# Patient Record
Sex: Male | Born: 1970 | Race: White | Hispanic: No | Marital: Married | Smoking: Never smoker
Health system: Southern US, Community
[De-identification: ages and names within clinical notes are randomized; demographics above are authoritative.]

## PROBLEM LIST (undated history)

## (undated) DIAGNOSIS — K219 Gastro-esophageal reflux disease without esophagitis: Secondary | ICD-10-CM

## (undated) HISTORY — PX: JOINT REPLACEMENT: SHX530

## (undated) HISTORY — DX: Gastro-esophageal reflux disease without esophagitis: K21.9

## (undated) HISTORY — PX: KNEE SURGERY: SHX244

---

## 1998-05-31 ENCOUNTER — Emergency Department (HOSPITAL_COMMUNITY): Admission: EM | Admit: 1998-05-31 | Discharge: 1998-05-31 | Payer: Self-pay | Admitting: Emergency Medicine

## 2004-01-20 ENCOUNTER — Emergency Department (HOSPITAL_COMMUNITY): Admission: EM | Admit: 2004-01-20 | Discharge: 2004-01-20 | Payer: Self-pay | Admitting: Emergency Medicine

## 2004-01-29 ENCOUNTER — Encounter: Admission: RE | Admit: 2004-01-29 | Discharge: 2004-01-29 | Payer: Self-pay | Admitting: Emergency Medicine

## 2005-05-18 ENCOUNTER — Encounter: Payer: Self-pay | Admitting: Emergency Medicine

## 2005-09-25 ENCOUNTER — Emergency Department (HOSPITAL_COMMUNITY): Admission: EM | Admit: 2005-09-25 | Discharge: 2005-09-26 | Payer: Self-pay | Admitting: Emergency Medicine

## 2007-03-02 ENCOUNTER — Encounter: Admission: RE | Admit: 2007-03-02 | Discharge: 2007-03-02 | Payer: Self-pay | Admitting: Emergency Medicine

## 2013-07-23 ENCOUNTER — Ambulatory Visit (INDEPENDENT_AMBULATORY_CARE_PROVIDER_SITE_OTHER): Payer: BC Managed Care – PPO | Admitting: Emergency Medicine

## 2013-07-23 ENCOUNTER — Ambulatory Visit (HOSPITAL_COMMUNITY)
Admission: RE | Admit: 2013-07-23 | Discharge: 2013-07-23 | Disposition: A | Discharge status: Home / Self Care | Payer: BC Managed Care – PPO | Source: Ambulatory Visit | Attending: Emergency Medicine | Admitting: Emergency Medicine

## 2013-07-23 VITALS — BP 144/98 | HR 78 | Temp 97.7°F | Resp 18 | Ht 71.0 in | Wt 207.0 lb

## 2013-07-23 DIAGNOSIS — M7989 Other specified soft tissue disorders: Secondary | ICD-10-CM

## 2013-07-23 DIAGNOSIS — M79609 Pain in unspecified limb: Secondary | ICD-10-CM | POA: Insufficient documentation

## 2013-07-23 LAB — CBC WITH DIFFERENTIAL/PLATELET
BASOS ABS: 0 10*3/uL (ref 0.0–0.1)
BASOS PCT: 0 % (ref 0–1)
EOS ABS: 0.2 10*3/uL (ref 0.0–0.7)
Eosinophils Relative: 2 % (ref 0–5)
HEMATOCRIT: 36.9 % — AB (ref 39.0–52.0)
HEMOGLOBIN: 13 g/dL (ref 13.0–17.0)
Lymphocytes Relative: 28 % (ref 12–46)
Lymphs Abs: 2.9 10*3/uL (ref 0.7–4.0)
MCH: 29.1 pg (ref 26.0–34.0)
MCHC: 35.2 g/dL (ref 30.0–36.0)
MCV: 82.6 fL (ref 78.0–100.0)
MONO ABS: 0.4 10*3/uL (ref 0.1–1.0)
MONOS PCT: 4 % (ref 3–12)
Neutro Abs: 6.8 10*3/uL (ref 1.7–7.7)
Neutrophils Relative %: 66 % (ref 43–77)
Platelets: 344 10*3/uL (ref 150–400)
RBC: 4.47 MIL/uL (ref 4.22–5.81)
RDW: 13.3 % (ref 11.5–15.5)
WBC: 10.3 10*3/uL (ref 4.0–10.5)

## 2013-07-23 LAB — POCT SEDIMENTATION RATE: POCT SED RATE: 38 mm/h — AB (ref 0–22)

## 2013-07-23 MED ORDER — ESOMEPRAZOLE MAGNESIUM 40 MG PO CPDR: 40.0000 mg | DELAYED_RELEASE_CAPSULE | Freq: Two times a day (BID) | ORAL | Status: DC

## 2013-07-23 NOTE — Progress Notes (Signed)
VASCULAR LAB PRELIMINARY  PRELIMINARY  PRELIMINARY  PRELIMINARY  Left lower extremity venous duplex completed.    Preliminary report:  Left:  No evidence of DVT, superficial thrombosis, or Baker's cyst.  Amelita Risinger, RVS 07/23/2013, 4:51 PM

## 2013-07-23 NOTE — Progress Notes (Signed)
[facility] [address], [place] [address] [phone]  Date:  07/23/2013   Name:  Mike MaisJeffrey Freshour   DOB:  14-Jul-1970   MRN:  [id number]  PCP:  No primary provider on file.    Chief Complaint: Joint Swelling   History of Present Illness:  Mike MaisJeffrey Rinn is a 43 y.o. very pleasant male patient who presents with the following:  Increasing symptoms of reflux with heart burn and waterbrash.  No nausea or vomiting.  No stool change, blood in stool or black stools. No hematemesis.  No relief with 20 BID nexium.  No NSAID or alcohol.  Has pain and swelling in left leg since middle of June.  Pain and tenderness.  Most swelling below knee.  Difficult to ambulate at times due discomfort.  No weakness, injury or over use.  No history or immobilization or travel.   No improvement with over the counter medications or other home remedies. Denies other complaint or health concern today.   There are no active problems to display for this patient.   Past Medical History  Diagnosis Date  . GERD (gastroesophageal reflux disease)     Past Surgical History  Procedure Laterality Date  . Joint replacement    . Knee surgery      History  Substance Use Topics  . Smoking status: Never Smoker   . Smokeless tobacco: Not on file  . Alcohol Use: No    Family History  Problem Relation Age of Onset  . Cancer Mother     No Known Allergies  Medication list has been reviewed and updated.  No current outpatient prescriptions on file prior to visit.   No current facility-administered medications on file prior to visit.    Review of Systems:  As per HPI, otherwise negative.    Physical Examination: Filed Vitals:   07/23/13 1317  BP: 144/98  Pulse: 78  Temp: 97.7 F (36.5 C)  Resp: 18   Filed Vitals:   07/23/13 1317  Height: 5\' 11"  (1.803 m)  Weight: 207 lb (93.895 kg)   Body mass index is 28.88 kg/(m^2). Ideal Body Weight: Weight in (lb) to have BMI = 25:  178.9  GEN: WDWN, NAD, Non-toxic, A & O x 3 HEENT: Atraumatic, Normocephalic. Neck supple. No masses, No LAD. Ears and Nose: No external deformity. CV: RRR, No M/G/R. No JVD. No thrill. No extra heart sounds. PULM: CTA B, no wheezes, crackles, rhonchi. No retractions. No resp. distress. No accessory muscle use. ABD: S, NT, ND, +BS. No rebound. No HSM. EXTR: No c/c/e NEURO Normal gait.  PSYCH: Normally interactive. Conversant. Not depressed or anxious appearing.  Calm demeanor.  LEG:  2 cm difference between right and left calf.  Left larger with some ankle fullness.  No erythema   Assessment and Plan: Edema let Elevate Negative Echo  Signed,  Phillips OdorJeffery Shalaina Guardiola, MD

## 2013-07-29 ENCOUNTER — Telehealth: Payer: Self-pay | Admitting: *Deleted

## 2013-07-29 NOTE — Telephone Encounter (Signed)
Pt's wife called concerning recent lab work. Doesn't look like it has been reviewed yet . Please advise. Thanks

## 2013-07-29 NOTE — Telephone Encounter (Signed)
How is the patient

## 2014-08-19 ENCOUNTER — Encounter (HOSPITAL_COMMUNITY): Payer: Self-pay | Admitting: Emergency Medicine

## 2014-08-19 ENCOUNTER — Emergency Department (HOSPITAL_COMMUNITY)
Admission: EM | Admit: 2014-08-19 | Discharge: 2014-08-20 | Disposition: A | Discharge status: Home / Self Care | Payer: BLUE CROSS/BLUE SHIELD | Attending: Emergency Medicine | Admitting: Emergency Medicine

## 2014-08-19 ENCOUNTER — Ambulatory Visit (INDEPENDENT_AMBULATORY_CARE_PROVIDER_SITE_OTHER): Payer: BLUE CROSS/BLUE SHIELD | Admitting: Family Medicine

## 2014-08-19 ENCOUNTER — Ambulatory Visit (INDEPENDENT_AMBULATORY_CARE_PROVIDER_SITE_OTHER): Payer: BLUE CROSS/BLUE SHIELD

## 2014-08-19 VITALS — BP 130/80 | HR 89 | Temp 98.3°F | Resp 18 | Ht 70.0 in | Wt 214.1 lb

## 2014-08-19 DIAGNOSIS — R0789 Other chest pain: Secondary | ICD-10-CM | POA: Insufficient documentation

## 2014-08-19 DIAGNOSIS — M546 Pain in thoracic spine: Secondary | ICD-10-CM | POA: Diagnosis not present

## 2014-08-19 DIAGNOSIS — Z79899 Other long term (current) drug therapy: Secondary | ICD-10-CM | POA: Insufficient documentation

## 2014-08-19 DIAGNOSIS — R911 Solitary pulmonary nodule: Secondary | ICD-10-CM | POA: Diagnosis not present

## 2014-08-19 DIAGNOSIS — I10 Essential (primary) hypertension: Secondary | ICD-10-CM | POA: Insufficient documentation

## 2014-08-19 DIAGNOSIS — K219 Gastro-esophageal reflux disease without esophagitis: Secondary | ICD-10-CM | POA: Diagnosis not present

## 2014-08-19 DIAGNOSIS — R079 Chest pain, unspecified: Secondary | ICD-10-CM | POA: Diagnosis present

## 2014-08-19 LAB — CBC
HCT: 38.6 % — ABNORMAL LOW (ref 39.0–52.0)
Hemoglobin: 13.2 g/dL (ref 13.0–17.0)
MCH: 29.1 pg (ref 26.0–34.0)
MCHC: 34.2 g/dL (ref 30.0–36.0)
MCV: 85.2 fL (ref 78.0–100.0)
Platelets: 348 10*3/uL (ref 150–400)
RBC: 4.53 MIL/uL (ref 4.22–5.81)
RDW: 12.4 % (ref 11.5–15.5)
WBC: 13.5 10*3/uL — ABNORMAL HIGH (ref 4.0–10.5)

## 2014-08-19 LAB — BASIC METABOLIC PANEL
Anion gap: 10 (ref 5–15)
BUN: 8 mg/dL (ref 6–20)
CO2: 29 mmol/L (ref 22–32)
Calcium: 9.6 mg/dL (ref 8.9–10.3)
Chloride: 97 mmol/L — ABNORMAL LOW (ref 101–111)
Creatinine, Ser: 0.84 mg/dL (ref 0.61–1.24)
GFR calc Af Amer: >60 mL/min (ref >60)
GFR calc non Af Amer: >60 mL/min (ref >60)
Glucose, Bld: 67 mg/dL (ref 65–99)
Potassium: 4.3 mmol/L (ref 3.5–5.1)
Sodium: 136 mmol/L (ref 135–145)

## 2014-08-19 LAB — I-STAT TROPONIN, ED: TROPONIN I, POC: 0 ng/mL (ref 0.00–0.08)

## 2014-08-19 NOTE — Progress Notes (Signed)
[facility] [address] [place] [address] [phone]  Date:  08/19/2014   Name:  Mike Gutierrez   DOB:  1970/01/26   MRN:  [id number]  PCP:  Jamal Collin, PA-C    Chief Complaint: Flank Pain   History of Present Illness:  Mike Gutierrez is a 44 y.o. very pleasant male patient who presents with the following: - Left sided thoracic pain that began last night.  - He is relatively healthy besides a diagnosis of GERD.  - He has never smoked.  - The pain is very positional and much worse with a deep breath as well.  He describes it as a stabbing pain through his left side.  - No trauma. - No recent illnesses.  He has had a periodic cough over the summer, non-productive.  - No medication specifically for this pain.  He takes percocet for bilateral knee pain.  He did take an extra percocet (2x 10mg  tablets) prior to coming to our office. He has not tried taking an OTC meds, including NSAIDs.  - He also took 2 Xanax prior to coming here today.  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   He denies any recent long car rides or plane flights.  He denies any leg swelling or calf pain. He was evaluated for a DVT in the past, but it was negative.  - He denies any family history of CAD or blood clots. His mom did have an aortic dissection and was a non-smoker.   There are no active problems to display for this patient.   Past Medical History  Diagnosis Date  . GERD (gastroesophageal reflux disease)     Past Surgical History  Procedure Laterality Date  . Joint replacement    . Knee surgery      History  Substance Use Topics  . Smoking status: Never Smoker   . Smokeless tobacco: Not on file  . Alcohol Use: No    Family History  Problem Relation Age of Onset  . Cancer Mother     No Known Allergies  Medication list has been reviewed and updated.  Current Outpatient Prescriptions on File Prior to Visit  Medication Sig  Dispense Refill  . ALPRAZolam (XANAX) 0.5 MG tablet Take 0.5 mg by mouth at bedtime as needed for anxiety.    . OXYCODONE HCL PO Take 10 mg by mouth 3 (three) times daily.     No current facility-administered medications on file prior to visit.    Review of Systems: Review of Systems  Constitutional: Negative for fever, chills and diaphoresis.  HENT: Negative for congestion.   Respiratory: Positive for cough (spoadic). Negative for hemoptysis, sputum production, shortness of breath and wheezing.   Cardiovascular: Negative for chest pain, palpitations, orthopnea and leg swelling.  Gastrointestinal: Negative for heartburn, nausea, vomiting, abdominal pain and constipation.  Genitourinary: Negative for dysuria, hematuria and flank pain.  Musculoskeletal: Positive for back pain. Negative for falls.  Neurological: Negative for dizziness, tingling and headaches.  Endo/Heme/Allergies: Does not bruise/bleed easily.  Psychiatric/Behavioral: The patient is nervous/anxious.     Physical Examination: Filed Vitals:   08/19/14 1857  BP: 130/80  Pulse: 89  Temp: 98.3 F (36.8 C)  Resp: 18   Filed Vitals:   08/19/14 1857  Height: 5\' 10"  (1.778 m)  Weight: 214 lb 2 oz (97.126 kg)   Body mass index is 30.72 kg/(m^2). Ideal Body Weight: Weight in (lb) to have BMI =  25: 173.9  GEN: WDWN, NAD, Non-toxic, A & O x 3 HEENT: Atraumatic, Normocephalic. Neck supple. No masses, No LAD. Ears and Nose: No external deformity. CV: RRR, No M/G/R. No JVD. No thrill. No extra heart sounds. PULM: CTA B, no wheezes, crackles, rhonchi. No retractions. No resp. distress. No accessory muscle use.  Tender with palpation along left mid-axillary thoracic ribs from T5-7.  Vague, ill-defined rash over this area and along mid-axillary line (3 small, <77mm papules, superiorly and 2 inferiorly).    ABD: S, NT, ND, +BS. No rebound. No HSM. EXTR: No c/c/e. Negative Homan's NEURO Normal gait.  PSYCH: Normally interactive.  Conversant. Not depressed or anxious appearing.  Calm demeanor.  MSK: Full RoM without pain.   [facility] reading (PRIMARY) by Dr. Mayford Knife. CXR shows mildly increased parabronchial marking and possible widening of the mediastinum. No other acute findings.   EKG: Sinus rhythm, rate 83. Possible increase voltage in precordial leads with early repolarization likely. No other concerning findings. QTc 382.   Assessment and Plan: After evaluation here, the differential remains relatively wide. His vitals were stable.  He does have a poorly defined rash that could be the beginning of a shingles outbreak.  Additionally, his pain seems to be positional and pleuritic possibly suggesting a possible thoracic muscle strain, PE, or early pneumonia. During his time here he started to report pain that began extending down his left arm, suggesting potential progression of his pathology.  - CXR & EKG unremarkable for acute findings.  CXR sent for STAT read was also negative(there was an vague linear opacity in the left lung field that was favored to be atelectasis). - With the chronicity and progression of the symptoms, Mr. Miron was given the option of taking an ambulance or having his wife drive him to the ED. He chose to proceed via his own transportation.  The ED was notified about his impending arrival.    Signed Guinevere Scarlet, MD

## 2014-08-19 NOTE — ED Notes (Signed)
Pt. reports left /lateral chest pain onset last night increases with deep inspiration , mild SOB , denies nausea or diaphoresis , seen at [facility] today EKG and chest x-ray done .

## 2014-08-19 NOTE — ED Notes (Signed)
EDP at bedside  

## 2014-08-19 NOTE — Patient Instructions (Signed)
Left sided chest discomfort:  - We have done a chest XR and EKG today.  This did not reveal any obvious irregularities.  - With no definite cause for your discomfort, which has been progressive over the last 36 hours we feel it is in your best interest to go to the [facility] ER.   -  You need to go directly to the emergency room as this could be a life threatening issue.  Please go directly there.  We will call ahead and let them know you are headed there.

## 2014-08-19 NOTE — ED Provider Notes (Signed)
CSN: [id number]     Arrival date & time 08/19/14  2127 History   First MD Initiated Contact with Patient 08/19/14 2249     Chief Complaint  Patient presents with  . Chest Pain     (Consider location/radiation/quality/duration/timing/severity/associated sxs/prior Treatment) HPI   Blood pressure 131/72, pulse 87, temperature 98.8 F (37.1 C), temperature source Oral, resp. rate 12, weight 216 lb (97.977 kg), SpO2 98 %.  Mike Gutierrez is a 44 y.o. male  with a PMH of GERD and HTN who presents with left sided chest pain onset last night. The pain woke him from sleep on the left side and is worse with movement and deep inspiration. The pain radiates to the left jaw and down the left arm. He went to urgent care earlier tonight and was referred to the ED. Patient took 3-4 oxycodone 10mg  and 1 xanex 0.5mg  for the pain, which have not helped. He was initially hypoxic at 89% RA. Laying on the left side has been the only alleviating factor. He becomes short of breath with deep inspiration and feels sharp pleuritic pain. He denies cough, N/V, dizziness, diaphoresis, fever, numbness/tingling, or headache. He denies recent travel, hospitalization, immobilization, trauma, and swelling of the distal limbs. Patient states that he smoked 1 cigarette per day for 6 months over 10 years ago, he heard that would be helpful for his IBS.  Past Medical History  Diagnosis Date  . GERD (gastroesophageal reflux disease)    Past Surgical History  Procedure Laterality Date  . Joint replacement    . Knee surgery     Family History  Problem Relation Age of Onset  . Cancer Mother    History  Substance Use Topics  . Smoking status: Never Smoker   . Smokeless tobacco: Not on file  . Alcohol Use: No    Review of Systems  10 systems reviewed and found to be negative, except as noted in the HPI.   Allergies  Review of patient's allergies indicates no known allergies.  Home Medications   Prior to Admission  medications   Medication Sig Start Date End Date Taking? Authorizing Provider  ALPRAZolam Prudy Feeler) 0.5 MG tablet Take 0.5 mg by mouth at bedtime as needed for anxiety.   Yes Historical Provider, MD  dexlansoprazole (DEXILANT) 60 MG capsule Take 120 mg by mouth daily. 10/10/13  Yes Historical Provider, MD  OXYCODONE HCL PO Take 10 mg by mouth 3 (three) times daily.   Yes Historical Provider, MD  oxyCODONE-acetaminophen (PERCOCET) 10-325 MG per tablet Take 1 tablet by mouth every 4 (four) hours as needed for pain.   Yes Historical Provider, MD  perindopril (ACEON) 8 MG tablet Take 8 mg by mouth daily.   Yes Historical Provider, MD   BP 133/81 mmHg  Pulse 93  Temp(Src) 98.8 F (37.1 C) (Oral)  Resp 12  Wt 216 lb (97.977 kg)  SpO2 92% Physical Exam  Constitutional: He is oriented to person, place, and time. He appears well-developed and well-nourished. No distress.  HENT:  Head: Normocephalic.  Mouth/Throat: Oropharynx is clear and moist.  Eyes: Conjunctivae are normal.  Neck: Normal range of motion. No JVD present. No tracheal deviation present.  Cardiovascular: Normal rate, regular rhythm and intact distal pulses.   Radial pulse equal bilaterally  Pulmonary/Chest: Effort normal and breath sounds normal. No stridor. No respiratory distress. He has no wheezes. He has no rales. He exhibits no tenderness.  Abdominal: Soft. He exhibits no distension and no mass. There is  no tenderness. There is no rebound and no guarding.  Musculoskeletal: Normal range of motion. He exhibits no edema or tenderness.  No calf asymmetry, superficial collaterals, palpable cords, edema, Homans sign negative bilaterally.    Neurological: He is alert and oriented to person, place, and time.  Skin: Skin is warm. He is not diaphoretic.  Psychiatric: He has a normal mood and affect.  Nursing note and vitals reviewed.   ED Course  Procedures (including critical care time) Labs Review Labs Reviewed  BASIC METABOLIC  PANEL - Abnormal; Notable for the following:    Chloride 97 (*)    All other components within normal limits  CBC - Abnormal; Notable for the following:    WBC 13.5 (*)    HCT 38.6 (*)    All other components within normal limits  I-STAT TROPOININ, ED  Mike Gutierrez, ED    Imaging Review Dg Chest 2 View  08/19/2014   CLINICAL DATA:  Left-sided chest pain tonight.  EXAM: CHEST  2 VIEW  COMPARISON:  None.  FINDINGS: The cardiac silhouette, mediastinal and hilar contours are within normal limits. Streaky left basilar density is likely atelectasis. No pleural effusion or pulmonary edema. The bony thorax is intact. Remote healed right mid clavicle fracture.  IMPRESSION: Streaky left basilar density is likely atelectasis. No definite infiltrates, effusions or edema.   Electronically Signed   By: Rudie Meyer M.D.   On: 08/19/2014 20:32     EKG Interpretation   Date/Time:  Tuesday August 19 2014 21:33:16 EDT Ventricular Rate:  94 PR Interval:  164 QRS Duration: 96 QT Interval:  330 QTC Calculation: 412 R Axis:   47 Text Interpretation:  Normal sinus rhythm Non-specific ST-t changes No old  tracing to compare Confirmed by KOHUT  MD, STEPHEN [phone]) on 08/19/2014  11:16:42 PM      MDM   Final diagnoses:  Pulmonary nodule  Atypical chest pain    Filed Vitals:   08/19/14 2230 08/19/14 2245 08/19/14 2300 08/20/14 0000  BP: 150/86 138/86 133/81 132/65  Pulse: 92 89 93 89  Temp:      TempSrc:      Resp: 17 9 12 9   Weight:      SpO2: 100% 93% 92% 97%    Medications  iohexol (OMNIPAQUE) 350 MG/ML injection 80 mL (80 mLs Intravenous Contrast Given 08/20/14 0007)    Mike Gutierrez is a pleasant 44 y.o. male presenting with left-sided chest pain radiating to the jaw and down the left arm. Patient is found to be hypoxic with a sat of 89% initially, there is no tachycardia. This may be secondary to drug overuse, patient took 30 mg of oxycodone in addition to Xanax this afternoon. His  EKG shows nonspecific changes, troponin is negative, patient with severe pain, hypoxia. Patient is low risk by heart score. Although clinically he doesn't look to have a DVT as a source of his pain cannot be readily identified, there is no real tenderness palpation on his chest wall is reasonable to obtain a CT to rule out PE. Bilateral blood pressure is less than 10 mmHg asymmetry.  Chest CT without pulmonary embolism, they noted a lingular nodule that is concerning for malignancy. They recommend PET/CT possible biopsy.  Discussed with patient, advised him to call follow closely with primary care and pulmonology referral is given as well.  Patient will be given Toradol in the ED, advised him to start taking a high-dose Motrin tomorrow. I cautioned him on taking oxycodone  and Xanax as directed, have explained to him that this will decreased his respiratory drive and could be fatal. Patient is wife verbalized understanding.  Delta troponin is negative. Patient has ambulated and maintained his oxygen saturation greater than 90%. Discussed case with attending physician we have reviewed the CT, she advises that this can be followed up as an outpatient and agrees with care plan and disposition.   Evaluation does not show pathology that would require ongoing emergent intervention or inpatient treatment. Pt is hemodynamically stable and mentating appropriately. Discussed findings and plan with patient/guardian, who agrees with care plan. All questions answered. Return precautions discussed and outpatient follow up given.       Wynetta Emery, PA-C 08/20/14 1528  Marisa Severin, MD 08/21/14 [phone]

## 2014-08-20 ENCOUNTER — Emergency Department (HOSPITAL_COMMUNITY): Payer: BLUE CROSS/BLUE SHIELD

## 2014-08-20 LAB — I-STAT TROPONIN, ED: TROPONIN I, POC: 0 ng/mL (ref 0.00–0.08)

## 2014-08-20 MED ORDER — IOHEXOL 350 MG/ML SOLN
80.0000 mL | Freq: Once | INTRAVENOUS | Status: AC | PRN
  Administered 2014-08-20: 80 mL via INTRAVENOUS

## 2014-08-20 MED ORDER — KETOROLAC TROMETHAMINE 30 MG/ML IJ SOLN
30.0000 mg | Freq: Once | INTRAMUSCULAR | Status: AC
  Administered 2014-08-20: 30 mg via INTRAVENOUS
  Filled 2014-08-20: qty 1

## 2014-08-20 NOTE — Discharge Instructions (Signed)
For pain control please take ibuprofen (also known as Motrin or Advil)  (this is normally 4 over the counter pills) 3 times a day  for 5 days. Take with food to minimize stomach irritation.  Your imaging today shows nodules in your lungs. Your primary care doctor must be made aware of this and further imaging may be ordered. Please make them aware that they will need to obtain records for further management.  Please follow with your primary care doctor in the next 2 days for a check-up. They must obtain records for further management.   Do not hesitate to return to the Emergency Department for any new, worsening or concerning symptoms.    Chest Wall Pain Chest wall pain is pain in or around the bones and muscles of your chest. It may take up to 6 weeks to get better. It may take longer if you must stay physically active in your work and activities.  CAUSES  Chest wall pain may happen on its own. However, it may be caused by:  A viral illness like the flu.  Injury.  Coughing.  Exercise.  Arthritis.  Fibromyalgia.  Shingles. HOME CARE INSTRUCTIONS   Avoid overtiring physical activity. Try not to strain or perform activities that cause pain. This includes any activities using your chest or your abdominal and side muscles, especially if heavy weights are used.  Put ice on the sore area.  Put ice in a plastic bag.  Place a towel between your skin and the bag.  Leave the ice on for 15-20 minutes per hour while awake for the first 2 days.  Only take over-the-counter or prescription medicines for pain, discomfort, or fever as directed by your caregiver. SEEK IMMEDIATE MEDICAL CARE IF:   Your pain increases, or you are very uncomfortable.  You have a fever.  Your chest pain becomes worse.  You have new, unexplained symptoms.  You have nausea or vomiting.  You feel sweaty or lightheaded.  You have a cough with phlegm (sputum), or you cough up blood. MAKE SURE YOU:     Understand these instructions.  Will watch your condition.  Will get help right away if you are not doing well or get worse. Document Released: 01/03/2005 Document Revised: 03/28/2011 Document Reviewed: 08/30/2010 [facility] Patient Information 2015 [place], [place]. This information is not intended to replace advice given to you by your health care provider. Make sure you discuss any questions you have with your health care provider.

## 2014-08-20 NOTE — ED Notes (Signed)
While ambulating Pt in hallway O2 Saturation dropped down to 90 percent. PA Pisciotta aware.

## 2014-08-20 NOTE — ED Notes (Signed)
EDP at bedside  

## 2014-08-20 NOTE — ED Notes (Signed)
O2 noted to be 89% on RA. Pt placed on 3LO2, sats went up to 97%

## 2014-08-21 NOTE — Progress Notes (Signed)
Patient discussed and examined with Dr. Williams. Agree with assessment and plan of care per his note.   

## 2014-08-22 ENCOUNTER — Ambulatory Visit (INDEPENDENT_AMBULATORY_CARE_PROVIDER_SITE_OTHER): Payer: BLUE CROSS/BLUE SHIELD | Admitting: Internal Medicine

## 2014-08-22 ENCOUNTER — Encounter: Payer: Self-pay | Admitting: Internal Medicine

## 2014-08-22 ENCOUNTER — Institutional Professional Consult (permissible substitution): Payer: BLUE CROSS/BLUE SHIELD | Admitting: Internal Medicine

## 2014-08-22 VITALS — BP 148/88 | HR 100 | Ht 71.0 in | Wt 211.0 lb

## 2014-08-22 DIAGNOSIS — J690 Pneumonitis due to inhalation of food and vomit: Secondary | ICD-10-CM

## 2014-08-22 DIAGNOSIS — K2100 Gastro-esophageal reflux disease with esophagitis, without bleeding: Secondary | ICD-10-CM

## 2014-08-22 DIAGNOSIS — R0683 Snoring: Secondary | ICD-10-CM | POA: Diagnosis not present

## 2014-08-22 DIAGNOSIS — K219 Gastro-esophageal reflux disease without esophagitis: Secondary | ICD-10-CM | POA: Insufficient documentation

## 2014-08-22 DIAGNOSIS — R1310 Dysphagia, unspecified: Secondary | ICD-10-CM

## 2014-08-22 DIAGNOSIS — R091 Pleurisy: Secondary | ICD-10-CM | POA: Diagnosis not present

## 2014-08-22 DIAGNOSIS — G4719 Other hypersomnia: Secondary | ICD-10-CM | POA: Insufficient documentation

## 2014-08-22 MED ORDER — LEVOFLOXACIN 750 MG PO TABS: 750.0000 mg | ORAL_TABLET | Freq: Every day | ORAL | Status: DC

## 2014-08-22 NOTE — Assessment & Plan Note (Signed)
-  Pain on inspiration, consistent with pleurisy, likely related to abnormalities seen on CT chest

## 2014-08-22 NOTE — Assessment & Plan Note (Signed)
The patient notes episodes of choking sensation and feeling that food is being stuck in his chest. This may be related to GERD and possible esophageal narrowing. -The patient has had an endoscopy in the past. He notes that these symptoms are new since that time. Therefore, I have referred him back to his gastroenterologist to have this reexamined. -This patient is cautioned not to eat 4 hours before bedtime. Also counseled him on the fatty meals to earlier in the day. -Patient is to continue on his current

## 2014-08-22 NOTE — Assessment & Plan Note (Signed)
-  The patient has occasional episodes of dysphagia and choking on food secondary to dysphagia. -The patient on chewing his food properly and eating slowly

## 2014-08-22 NOTE — Assessment & Plan Note (Addendum)
-  The patient's wife is present for evaluation today and notes that the patient snores loudly at night. Patient also complains of daytime sleepiness occasionally insomnia. -The patient is medications for ADD contributing to insomnia. -We'll discuss possibility of sending the patient for sleep study

## 2014-08-22 NOTE — Assessment & Plan Note (Signed)
-  Small area of consolidation seen in the left lingular area. Given the patient's history of recent possible aspiration episodes. This may be consistent with an episode of aspiration pneumonia. -Patient is given a course of Levaquin 750 mg by mouth 7 days. -I doubt that this represents a pulmonary nodule. We will repeat a CT of his chest in approximately 6 weeks' time to ensure resolution.

## 2014-08-22 NOTE — Patient Instructions (Signed)
Refer back to gastroenterology for dysphagia.  No eating at least 4 before bedtime.  levaquin 750 mg daily for 5 days. Repeat CT chest in 6 to 8 weeks and follow up after that.  Naproxen OTC; 2 tabs twice daily until pain subsides. If not better in 1 week call.

## 2014-08-22 NOTE — Progress Notes (Signed)
Mike Gutierrez Mike Gutierrez      Assessment and Plan: Aspiration pneumonia -Small area of consolidation seen in the left lingular area. Given the patient's history of recent possible aspiration episodes. This may be consistent with an episode of aspiration pneumonia. -Patient is given a course of Levaquin 750 mg by mouth 7 days. -I doubt that this represents a pulmonary nodule. We will repeat a CT of his chest in approximately 6 weeks' time to ensure resolution.  Pleurisy -Pain on inspiration, consistent with pleurisy, likely related to abnormalities seen on CT chest  GERD (gastroesophageal reflux disease) The patient notes episodes of choking sensation and feeling that food is being stuck in his chest. This may be related to GERD and possible esophageal narrowing. -The patient has had an endoscopy in the past. He notes that these symptoms are new since that time. Therefore, I have referred him back to his gastroenterologist to have this reexamined. -This patient is cautioned not to eat 4 hours before bedtime. Also counseled him on the fatty meals to earlier in the day. -Patient is to continue on his current  Dysphagia -The patient has occasional episodes of dysphagia and choking on food secondary to dysphagia. -The patient on chewing his food properly and eating slowly  Excessive daytime sleepiness -The patient's wife is present for evaluation today and notes that the patient snores loudly at night. Patient also complains of daytime sleepiness occasionally insomnia. -The patient is medications for ADD contributing to insomnia. -We'll discuss possibility of sending the patient for sleep study      Date: 08/22/2014  MRN# [id number] Mike Gutierrez 03-Mar-1970  Referring Physician:   Kamel Gutierrez is a 44 y.o. old male seen in [facility] for   CC:  Chief Complaint  Patient presents with  . Advice Only    pain on LT side into back at chest line; since Monday  night; woke up gasping for air Sunday night; sharp pain with deep breath; hurts while laying in certain positions; chest tightness;     HPI:  The patient is a 44 year old nonsmoker with a history of GERD and anxiety. He was seen by his primary care physician 08/19/2014 due to Left-sided thoracic pain. At that time, the patient was noted to be sharp and stabbing in nature and made worse by deep breaths. He was referred to the emergency room. He underwent a chest x-ray and CT scan. I reviewed the images and the reports, the chest x-ray appeared to be unremarkable aside for some left lower lobe streaky atelectasis. CT of the chest from 08/19/2014 shows some areas of pneumonia versus atelectasis in the left lingula.  The problem started 3 days ago, he woke up with sharp stabbing pain and was having trouble taking a deep breath. He noted sharp pain on the left side. He had some chills. He had no recent cold, they have 3 kids but no one was sick. He denies cough. He has dysphagia issues, he notices that  2 to 3 times per day he developing coughing spells, his mother had to have her esophagus stretched. He notices that he feels that his food gets stuck in his throat. He has had an endoscopy gastritis. Since the endoscopy which was about a year ago he has noted that food is getting stuck in his throat.   Other than this recent episode he denies trouble breathing, he is not a smoker and has no significant smoking history.   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 He has a history of irritable bowel.     PMHX:   Past Medical History  Diagnosis Date  . GERD (gastroesophageal reflux disease)    Surgical Hx:  Past Surgical History  Procedure Laterality Date  . Joint replacement    . Knee surgery     Family Hx:  Family History  Problem Relation Age of Onset  . Cancer Mother    Social Hx:   History  Substance Use Topics  . Smoking status: Never Smoker   . Smokeless tobacco: Not on  file  . Alcohol Use: No   Medication:   Current Outpatient Rx  Name  Route  Sig  Dispense  Refill  . ALPRAZolam (XANAX) 0.5 MG tablet   Oral   Take 0.5 mg by mouth at bedtime as needed for anxiety.         Marland Kitchen amphetamine-dextroamphetamine (ADDERALL) 30 MG tablet   Oral   Take 60 mg by mouth daily.         Marland Kitchen dexlansoprazole (DEXILANT) 60 MG capsule   Oral   Take 120 mg by mouth daily.         . OXYCODONE HCL PO   Oral   Take 10 mg by mouth 3 (three) times daily.         . perindopril (ACEON) 8 MG tablet   Oral   Take 8 mg by mouth daily.             Allergies:  Review of patient's allergies indicates no known allergies.  Review of Systems: Gen:  Denies  fever, sweats, chills HEENT: Denies blurred vision, double vision, ear pain, Cvc:  No dizziness, chest pain or heaviness Resp:   Denies cough or sputum porduction, shortness of breath Gi: Denies swallowing difficulty, stomach pain, Gu:  Denies bladder incontinence, burning urine Ext:   No Joint pain, stiffness or swelling Skin: No skin rash, easy bruising or bleeding or hives Endoc:  No polyuria, polydipsia , polyphagia Psych: No depression, insomnia or hallucinations  Other:  All other systems negative  Physical Examination:   VS: BP 148/88 mmHg  Pulse 100  Ht 5\' 11"  (1.803 m)  Wt 211 lb (95.709 kg)  BMI 29.44 kg/m2  SpO2 99%  General Appearance: No distress  Neuro:without focal findings,  speech normal,  HEENT: PERRLA, EOM intact.  Mallampati 3 Pulmonary: normal breath sounds, No wheezing, No rales;    CardiovascularNormal S1,S2.  No m/r/g.   Abdomen: Benign, Soft, non-tender. Renal:  No costovertebral tenderness  GU:  No performed at this time. Endoc: No evident thyromegaly, no signs of acromegaly or Cushing features Skin:   warm, no rashes, no ecchymosis  Extremities: normal, no cyanosis, clubbing.    Orders for this visit: No orders of the defined types were placed in this encounter.       Thank  you for the Gutierrez and for allowing [facility] to assist in the care of your patient. Our recommendations are noted above.  Please contact us if we can be of further service.   Mike Guiles, MD.  Board Certified in Internal Medicine, Pulmonary Medicine, Critical Care Medicine, and Sleep Medicine.  [facility] Office Number: [phone]  Santiago Glad, M.D.  Stephanie Acre, M.D.  Carolyne Fiscal, M.D

## 2014-08-24 ENCOUNTER — Telehealth: Payer: Self-pay | Admitting: Pulmonary Disease

## 2014-08-24 MED ORDER — DOXYCYCLINE HYCLATE 100 MG PO CAPS: 100.0000 mg | ORAL_CAPSULE | Freq: Two times a day (BID) | ORAL | Status: DC

## 2014-08-24 NOTE — Telephone Encounter (Signed)
Patient started on Levaquin for pneumonia recently. Wife reports he has had altered mentation with mild myalgias & arthalgias today in his knees and calf. Reportedly he had "chest pain" this morning on his left side as well.  Wife denies any rash or itching. Describes his feeling as "out of body". Wife reports he is "seeing spots" when he closes his eyes. Currently on day 3 of 5 of treatment. Denies any achilles tendonitis. Instructed to stop Levaquin. Starting Doxycycline  po bid x7 days. She'll call the office tomorrow to receive further directions. Also instructed to seek immediate medical attention if any new symptoms arise.

## 2014-08-25 ENCOUNTER — Telehealth: Payer: Self-pay | Admitting: Internal Medicine

## 2014-08-25 NOTE — Telephone Encounter (Signed)
Pt wife aware of rec's per BQ - aware to call if any increased SOB, chest pain or high fever.  Cont Doxy as directed. Nothing further needed.

## 2014-08-25 NOTE — Telephone Encounter (Signed)
Per phone note 08/24/14 Roslynn Amble, MD at 08/24/2014 1:59 PM     Status: Signed       Expand All Collapse All   Patient started on Levaquin for pneumonia recently. Wife reports he has had altered mentation with mild myalgias & arthalgias today in his knees and calf. Reportedly he had "chest pain" this morning on his left side as well. Wife denies any rash or itching. Describes his feeling as "out of body". Wife reports he is "seeing spots" when he closes his eyes. Currently on day 3 of 5 of treatment. Denies any achilles tendonitis. Instructed to stop Levaquin. Starting Doxycycline  po bid x7 days. She'll call the office tomorrow to receive further directions. Also instructed to seek immediate medical attention if any new symptoms arise.        Spoke with pt wife - calling about new meds Rxd by Doc on call (Dr Jamison Neighbor) 08/24/14.  Pt using aleve prn chest discomfort. Stopped Levaquin 08/25/14. Pt started Doxycycline  today (around 9:30am). Wife reports rattling in chest today.  Pt wife would like to know if this is normal improvement (mucus breaking up) as he takes abx or if this is a sign of his infection worsening? Please advise Dr Kendrick Fries as Dr Rosilyn Mings is not available until 09/01/14. Thanks.

## 2014-08-25 NOTE — Telephone Encounter (Signed)
That can be normal with pneumonia.  Typically people will produce more mucus after a few days of treatment. It would be worrisome if he is more short of breath or having chest pain or a high fever. rec keep taking the doxycycline

## 2014-09-01 ENCOUNTER — Encounter: Payer: Self-pay | Admitting: Internal Medicine

## 2014-09-01 ENCOUNTER — Ambulatory Visit (INDEPENDENT_AMBULATORY_CARE_PROVIDER_SITE_OTHER): Payer: BLUE CROSS/BLUE SHIELD | Admitting: Internal Medicine

## 2014-09-01 ENCOUNTER — Ambulatory Visit
Admission: RE | Admit: 2014-09-01 | Discharge: 2014-09-01 | Disposition: A | Discharge status: Home / Self Care | Payer: BLUE CROSS/BLUE SHIELD | Source: Ambulatory Visit | Attending: Internal Medicine | Admitting: Internal Medicine

## 2014-09-01 VITALS — BP 168/110 | HR 67 | Wt 211.0 lb

## 2014-09-01 DIAGNOSIS — G4719 Other hypersomnia: Secondary | ICD-10-CM | POA: Diagnosis not present

## 2014-09-01 DIAGNOSIS — J690 Pneumonitis due to inhalation of food and vomit: Secondary | ICD-10-CM

## 2014-09-01 DIAGNOSIS — K2100 Gastro-esophageal reflux disease with esophagitis, without bleeding: Secondary | ICD-10-CM

## 2014-09-01 DIAGNOSIS — R1310 Dysphagia, unspecified: Secondary | ICD-10-CM

## 2014-09-01 DIAGNOSIS — R091 Pleurisy: Secondary | ICD-10-CM

## 2014-09-01 DIAGNOSIS — R0683 Snoring: Secondary | ICD-10-CM | POA: Diagnosis not present

## 2014-09-01 DIAGNOSIS — R918 Other nonspecific abnormal finding of lung field: Secondary | ICD-10-CM | POA: Insufficient documentation

## 2014-09-01 MED ORDER — PREDNISONE 10 MG PO TABS: ORAL_TABLET | ORAL | Status: DC

## 2014-09-01 NOTE — Patient Instructions (Addendum)
--  2 view chest xray today.  --Will start prednisone taper, take 5- 10 mg tabs for 1 day, then decrease by 1 tab daily until down to 1 tablet (5-4-3-2-1, etc). Continue taking 1 tablet daily for 1 week (total of 23 tabs). --Continue monitoring dietary intake.  --Call gastroenterologist for follow up appointment.  --CT chest in 4 weeks as scheduled at next appointment.

## 2014-09-01 NOTE — Progress Notes (Signed)
[facility]     Assessment and Plan: Aspiration pneumonia -Small area of consolidation seen in the left lingular area. Given the patient's history of recent possible aspiration episodes. This may be consistent with an episode of aspiration pneumonia.the patient had an episode of chest painand possible reaction to the Levaquin. Therefore, this was stoppedand the patient was put on doxycycline.He has now completed the Doxycycline. -I doubt that this represents a pulmonary nodule. We will repeat a CT of his chest in approximately 1 months time to ensure resolution.  Pleurisy -his pleuritic chest pain has not resolved. Therefore, we will start a course of prednisone instead of naproxen. I will also send him for a 2 view chest xray today.   Excessive daytime sleepiness -possible obstructive sleep apnea -We will send for sleep study once his overall condition improves.  Snoring -possible sleep apnea.  Dysphagia -May be contributing to aspiration.  -will need follow-up evaluation with Gastroenterology as he may require esophageal stretching.       Date: 09/01/2014  MRN# [id number] Mike Gutierrez Jul 11, 1970  HPI:   He has been doing better for a few days, but then the pain came back while he was working in the yard, he was using the vacuum at the time. It was not as bad as it had been in the past. Ht came on again yesterday and could not get a deep breath.  [place] to [place] and is worried about travelling.  He thinks he is now feeling better,  He felt wired with the levaquin and was changed to doxy which he has completed. The pain is better, but is still present.  He been coughing up some stuff over the week, which was green, that is better now.   He was taking ibuprofen every 4 hours and aleve every 6 hours, but did not think that they were helping, and stopped taking them.     PMHX:   Past Medical History  Diagnosis Date  . GERD  (gastroesophageal reflux disease)    Surgical Hx:  Past Surgical History  Procedure Laterality Date  . Joint replacement    . Knee surgery     Family Hx:  Family History  Problem Relation Age of Onset  . Cancer Mother    Social Hx:   Social History  Substance Use Topics  . Smoking status: Never Smoker   . Smokeless tobacco: None  . Alcohol Use: No   Medication:   Current Outpatient Rx  Name  Route  Sig  Dispense  Refill  . ALPRAZolam (XANAX) 0.5 MG tablet   Oral   Take 0.5 mg by mouth at bedtime as needed for anxiety.         Marland Kitchen amphetamine-dextroamphetamine (ADDERALL) 30 MG tablet   Oral   Take 60 mg by mouth daily.         Marland Kitchen dexlansoprazole (DEXILANT) 60 MG capsule   Oral   Take 120 mg by mouth daily.         . OXYCODONE HCL PO   Oral   Take 10 mg by mouth 3 (three) times daily.         . perindopril (ACEON) 8 MG tablet   Oral   Take 8 mg by mouth daily.             Allergies:  Review of patient's allergies indicates no known allergies.  Review of Systems: Gen:  Denies  fever, sweats, chills HEENT: Denies  blurred vision, double vision, Cvc:  No dizziness, chest pain or heaviness Resp:   Denies cough or sputum porduction,  Gi: Denies swallowing difficulty, stomach pain, Gu:  Denies bladder incontinence, burning urine Ext:   No Joint pain, stiffness or swelling Skin: No skin rash, easy bruising or bleeding or hives Endoc:  No polyuria, polydipsia , polyphagia Psych: No depression, insomnia or hallucinations  Other:  All other systems negative  Physical Examination:   VS: BP 168/110 mmHg  Pulse 67  Wt 211 lb (95.709 kg)  SpO2 99%  General Appearance: No distress  Neuro:without focal findings,  speech normal,  HEENT: PERRLA, EOM intact.  Mallampati 3 Pulmonary: normal breath sounds, No wheezing, No rales;    CardiovascularNormal S1,S2.  No m/r/g.   Abdomen: Benign, Soft, non-tender. Renal:  No costovertebral tenderness  GU:  No  performed at this time. Endoc: No evident thyromegaly. Skin:   warm, no rashes, no ecchymosis  Extremities: normal, no cyanosis, clubbing.    Orders for this visit: No orders of the defined types were placed in this encounter.        Wells Guiles, MD.  Board Certified in Internal Medicine, Pulmonary Medicine, Critical Care Medicine, and Sleep Medicine.  [facility] Office Number: [phone]  Santiago Glad, M.D.  Stephanie Acre, M.D.  Carolyne Fiscal, M.D

## 2014-09-01 NOTE — Assessment & Plan Note (Addendum)
-  May be contributing to aspiration.  -will need follow-up evaluation with Gastroenterology as he may require esophageal stretching.

## 2014-09-01 NOTE — Assessment & Plan Note (Addendum)
-  Small area of consolidation seen in the left lingular area. Given the patient's history of recent possible aspiration episodes. This may be consistent with an episode of aspiration pneumonia.the patient had an episode of chest painand possible reaction to the Levaquin. Therefore, this was stoppedand the patient was put on doxycycline.He has now completed the Doxycycline. -I doubt that this represents a pulmonary nodule. We will repeat a CT of his chest in approximately 1 months time to ensure resolution.

## 2014-09-01 NOTE — Assessment & Plan Note (Signed)
-  possible sleep apnea.

## 2014-09-01 NOTE — Assessment & Plan Note (Signed)
-  possible obstructive sleep apnea -We will send for sleep study once his overall condition improves.

## 2014-09-01 NOTE — Addendum Note (Signed)
Addended by: Meyer Cory R on: 09/01/2014 03:57 PM   Modules accepted: Orders

## 2014-09-01 NOTE — Assessment & Plan Note (Signed)
-  his pleuritic chest pain has not resolved. Therefore, we will start a course of prednisone instead of naproxen

## 2014-09-01 NOTE — Addendum Note (Signed)
Addended by: Meyer Cory R on: 09/01/2014 03:53 PM   Modules accepted: Orders

## 2014-10-10 ENCOUNTER — Ambulatory Visit
Admission: RE | Admit: 2014-10-10 | Discharge: 2014-10-10 | Disposition: A | Discharge status: Home / Self Care | Payer: BLUE CROSS/BLUE SHIELD | Source: Ambulatory Visit | Attending: Internal Medicine | Admitting: Internal Medicine

## 2014-10-10 DIAGNOSIS — I2510 Atherosclerotic heart disease of native coronary artery without angina pectoris: Secondary | ICD-10-CM | POA: Insufficient documentation

## 2014-10-10 DIAGNOSIS — Z09 Encounter for follow-up examination after completed treatment for conditions other than malignant neoplasm: Secondary | ICD-10-CM | POA: Insufficient documentation

## 2014-10-10 DIAGNOSIS — J690 Pneumonitis due to inhalation of food and vomit: Secondary | ICD-10-CM

## 2016-05-05 ENCOUNTER — Institutional Professional Consult (permissible substitution): Payer: BLUE CROSS/BLUE SHIELD | Admitting: Pulmonary Disease

## 2016-05-24 ENCOUNTER — Ambulatory Visit (INDEPENDENT_AMBULATORY_CARE_PROVIDER_SITE_OTHER): Payer: BLUE CROSS/BLUE SHIELD | Admitting: Pulmonary Disease

## 2016-05-24 ENCOUNTER — Encounter: Payer: Self-pay | Admitting: Pulmonary Disease

## 2016-05-24 ENCOUNTER — Ambulatory Visit (INDEPENDENT_AMBULATORY_CARE_PROVIDER_SITE_OTHER)
Admission: RE | Admit: 2016-05-24 | Discharge: 2016-05-24 | Disposition: A | Discharge status: Home / Self Care | Payer: BLUE CROSS/BLUE SHIELD | Source: Ambulatory Visit | Attending: Pulmonary Disease | Admitting: Pulmonary Disease

## 2016-05-24 VITALS — BP 154/90 | HR 72 | Ht 71.0 in | Wt 213.0 lb

## 2016-05-24 DIAGNOSIS — R0600 Dyspnea, unspecified: Secondary | ICD-10-CM

## 2016-05-24 DIAGNOSIS — R0602 Shortness of breath: Secondary | ICD-10-CM

## 2016-05-24 NOTE — Patient Instructions (Signed)
Try to focus on your breathing while you are at work, intentionally take deep breaths as we discussed Exercise, just 10 minutes of walking in the morning is a good start We will arrange for a chest x-ray and breathing test We will see you back in 6-8 weeks or sooner if needed

## 2016-05-24 NOTE — Progress Notes (Signed)
Subjective:    Patient ID: Mike Gutierrez, male    DOB: February 19, 1970, 46 y.o.   MRN: [id number]  Synopsis:  HPI Chief Complaint  Patient presents with  . Advice Only    Former PR pt re-establishing care with our office- presents with increased SOB    Mike Gutierrez would like to be seen because he has had a couple of episodes of pneumonia.  He was evaluated in the past for aspiration pneumonia and as a part of that work up he was told he had a "spot" on his lung.  He recalls that when he had pneumonia in the past he had severe right sided chest pain.  He ultimately saw pulmonary and was treated with antibiotics which made the symptoms go away.    He notes some dyspnea when he exerts himself.  > this is worse in the afternoon > typically not in the mornings > better if he takes a deep breath > worse when he bends over > just walking on level ground will make him short of breath > he has noticed that he will hold his breath while focused on something at work that requires his attention > this has been worse in the last 6 months > intentionally changing his breathing patterns makes it better  He doesn't have time to exercise.   He never smoked cigarettes in the past.  Never told he had a lung problem like asthma as a child.     Past Medical History:  Diagnosis Date  . GERD (gastroesophageal reflux disease)      Family History  Problem Relation Age of Onset  . Cancer Mother      Social History   Social History  . Marital status: Married    Spouse name: N/A  . Number of children: N/A  . Years of education: N/A   Occupational History  . Not on file.   Social History Main Topics  . Smoking status: Never Smoker  . Smokeless tobacco: Never Used  . Alcohol use No  . Drug use: No  . Sexual activity: Not on file   Other Topics Concern  . Not on file   Social History Narrative  . No narrative on file     No Known Allergies   Outpatient Medications Prior to Visit    Medication Sig Dispense Refill  . ALPRAZolam (XANAX) 0.5 MG tablet Take 0.5 mg by mouth at bedtime as needed for anxiety.    Marland Kitchen. amphetamine-dextroamphetamine (ADDERALL) 30 MG tablet Take 60 mg by mouth daily.    . OXYCODONE HCL PO Take 10 mg by mouth 3 (three) times daily.    . perindopril (ACEON) 8 MG tablet Take 8 mg by mouth daily.    Marland Kitchen. dexlansoprazole (DEXILANT) 60 MG capsule Take 120 mg by mouth daily.    . predniSONE (DELTASONE) 10 MG tablet Take 5 10mg  tabs for 1 day, then decrease by 1 tab daily until down to 1 tab then continue taking 1 tab daily for 1 week (Patient not taking: Reported on 05/24/2016) 23 tablet 0   No facility-administered medications prior to visit.       Review of Systems  Constitutional: Negative for fever and unexpected weight change.  HENT: Negative for congestion, dental problem, ear pain, nosebleeds, postnasal drip, rhinorrhea, sinus pressure, sneezing, sore throat and trouble swallowing.   Eyes: Negative for redness and itching.  Respiratory: Positive for chest tightness, shortness of breath and wheezing. Negative for cough.   Cardiovascular: Negative  for palpitations and leg swelling.  Gastrointestinal: Negative for nausea and vomiting.  Genitourinary: Negative for dysuria.  Musculoskeletal: Negative for joint swelling.  Skin: Negative for rash.  Neurological: Negative for headaches.  Hematological: Does not bruise/bleed easily.  Psychiatric/Behavioral: Negative for dysphoric mood. The patient is not nervous/anxious.        Objective:   Physical Exam Vitals:   05/24/16 1604  BP: (!) 154/90  Pulse: 72  SpO2: 98%  Weight: 213 lb (96.6 kg)  Height: 5\' 11"  (1.803 m)    RA  Gen: well appearing, no acute distress HENT: NCAT, OP clear, neck supple without masses Eyes: PERRL, EOMi Lymph: no cervical lymphadenopathy PULM: CTA B CV: RRR, no mgr, no JVD GI: BS+, soft, nontender, no hsm Derm: no rash or skin breakdown MSK: normal bulk and  tone Neuro: A&Ox4, CN II-XII intact, strength 5/5 in all 4 extremities Psyche: normal mood and affect  Records from his visit to pulmonary in 2016 reviewed were he was seen for presumed aspiration pneumonia  CT chest images from September 2016 independently reviewed showing normal pulmonary parenchyma bilaterally with a small scar in the lingula consistent with resolving pneumonia      Assessment & Plan:  Dyspnea Mike Gutierrez is here to see me today primarily for dyspnea which has been worsening in the last 6 months. 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 Interestingly, he has security camera set up around the shop and he has noticed that he holds his breath for long periods of time while he is working. This may in fact be the cause of his dyspnea as his lungs are clear on exam today, his oxygenation is normal, and imaging from his chest in 2016 was completely normal.  That said, the differential diagnosis of dyspnea is broad and includes lung disease, heart disease, bleeding/anemia and neuromuscular weakness. Based on his physical exam today I cannot identify any of these problems. He did have some coronary calcification seen on his 2016 CT chest.  Plan: Pulmonary function test Chest x-ray I encouraged him to exercise more frequently I encouraged him to focus on his breathing while at work Return to clinic in 6-8 weeks, if dyspnea has not improved then pursue a cardiac workup.    Current Outpatient Prescriptions:  .  ALPRAZolam (XANAX) 0.5 MG tablet, Take 0.5 mg by mouth at bedtime as needed for anxiety., Disp: , Rfl:  .  amphetamine-dextroamphetamine (ADDERALL) 30 MG tablet, Take 60 mg by mouth daily., Disp: , Rfl:  .  esomeprazole (NEXIUM) 40 MG capsule, Take 40 mg by mouth daily at 12 noon., Disp: , Rfl:  .  OXYCODONE HCL PO, Take 10 mg by mouth 3 (three) times daily., Disp: , Rfl:  .  perindopril (ACEON) 8 MG tablet, Take 8 mg by mouth  daily., Disp: , Rfl:

## 2016-05-24 NOTE — Assessment & Plan Note (Signed)
Mike Gutierrez is here to see me today primarily for dyspnea which has been worsening in the last 6 months. 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 Interestingly, he has security camera set up around the shop and he has noticed that he holds his breath for long periods of time while he is working. This may in fact be the cause of his dyspnea as his lungs are clear on exam today, his oxygenation is normal, and imaging from his chest in 2016 was completely normal.  That said, the differential diagnosis of dyspnea is broad and includes lung disease, heart disease, bleeding/anemia and neuromuscular weakness. Based on his physical exam today I cannot identify any of these problems. He did have some coronary calcification seen on his 2016 CT chest.  Plan: Pulmonary function test Chest x-ray I encouraged him to exercise more frequently I encouraged him to focus on his breathing while at work Return to clinic in 6-8 weeks, if dyspnea has not improved then pursue a cardiac workup.

## 2016-08-09 ENCOUNTER — Ambulatory Visit: Payer: BLUE CROSS/BLUE SHIELD | Admitting: Pulmonary Disease

## 2016-08-16 ENCOUNTER — Ambulatory Visit: Payer: BLUE CROSS/BLUE SHIELD | Admitting: Pulmonary Disease

## 2017-01-14 IMAGING — CT CT ANGIO CHEST
2 of 8 series · 18 of 46 positions shown · IV contrast (omnipaque)
Comparison: Chest radiograph performed 03/02/2007

CLINICAL DATA: Acute onset of shortness of breath and left-sided
chest pain. Pain radiates into the left arm. Initial encounter.

EXAM:
CT ANGIOGRAPHY CHEST WITH CONTRAST
TECHNIQUE: Multidetector CT imaging of the chest was performed using the
standard protocol during bolus administration of intravenous
contrast. Multiplanar CT image reconstructions and MIPs were
obtained to evaluate the vascular anatomy.
CONTRAST:  80mL OMNIPAQUE IOHEXOL 350 MG/ML SOLN

[Series 5: thins · axial · 0.74mm/px · z∈[-324,-24]mm · 15 of 331 slices shown]
[im 16/331  lung]
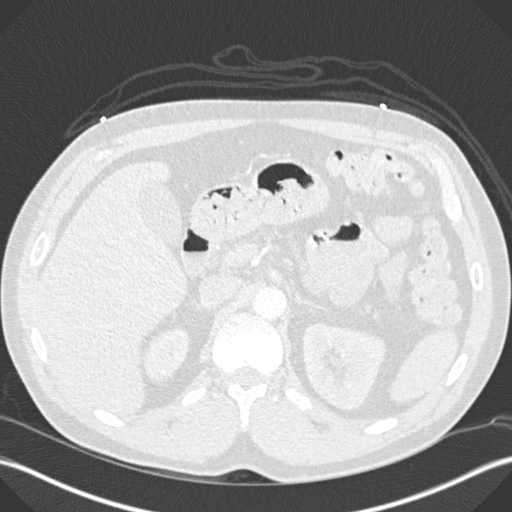
[im 46/331  soft-tissue]
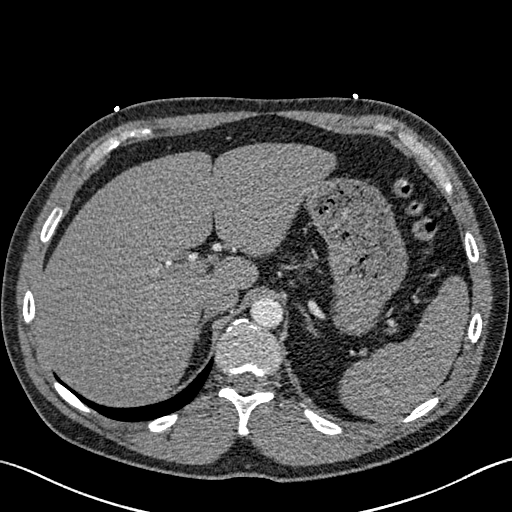
[im 61/331  lung]
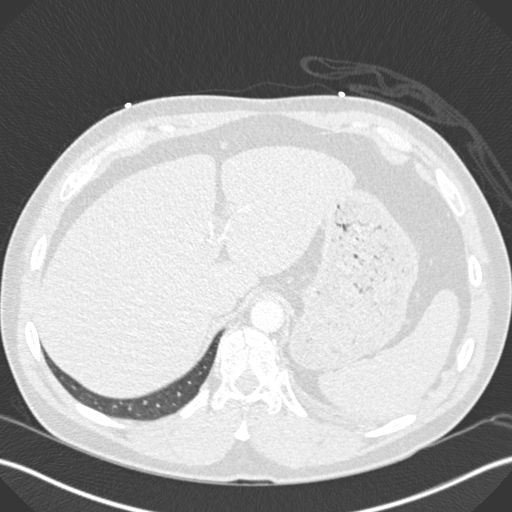
[im 76/331  soft-tissue]
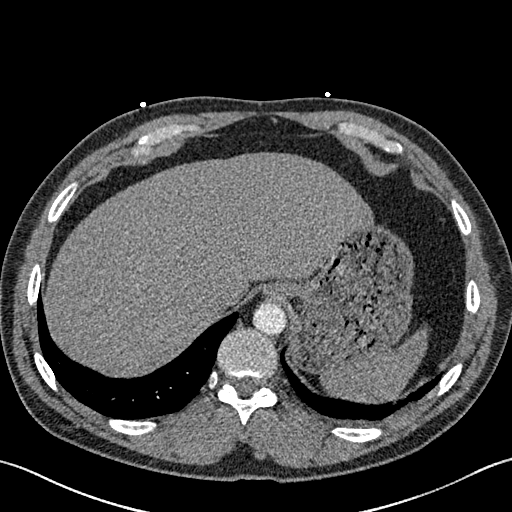
[im 106/331  lung]
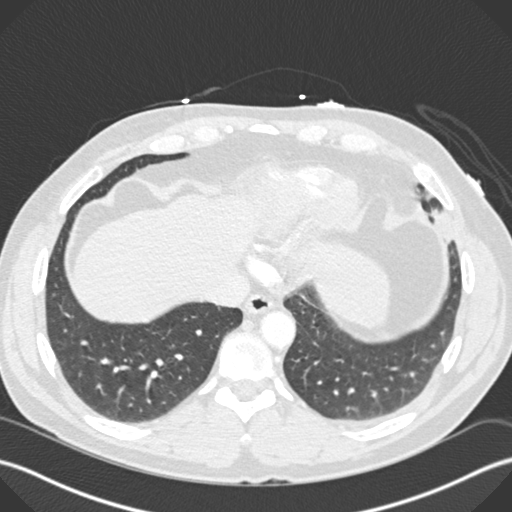
[im 121/331  soft-tissue]
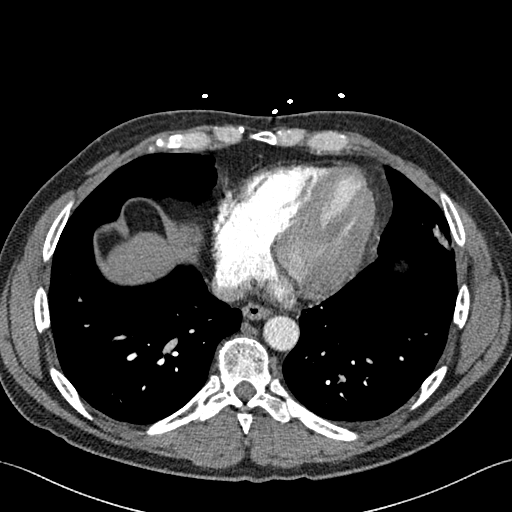
[im 151/331  lung]
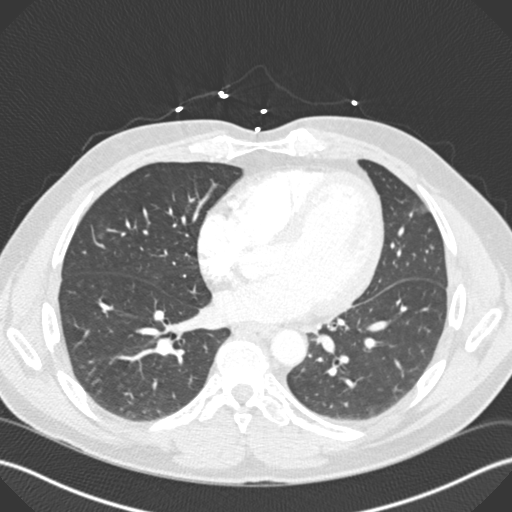
[im 166/331  soft-tissue]
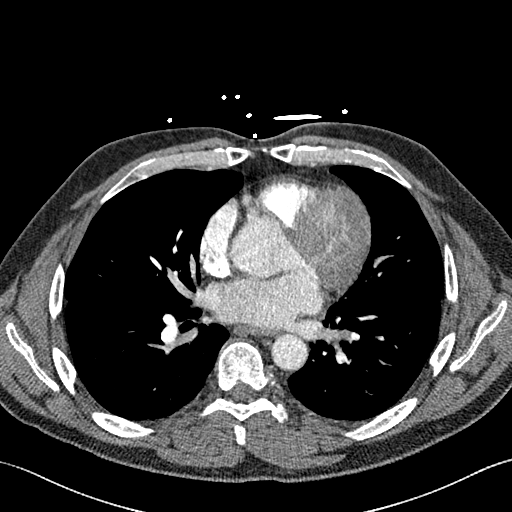
[im 181/331  lung]
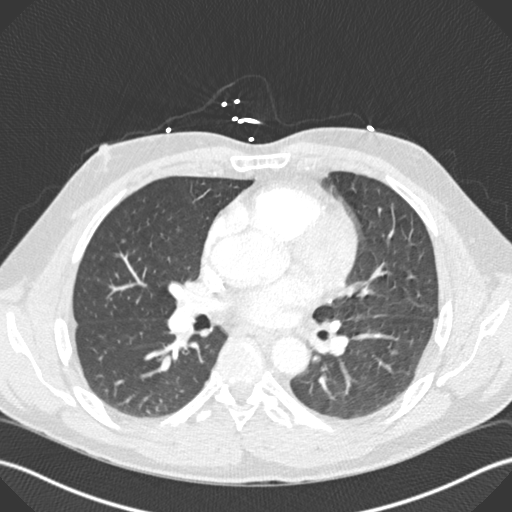
[im 211/331  soft-tissue]
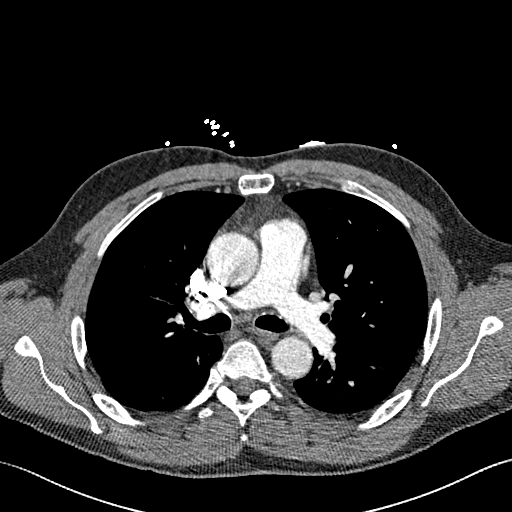
[im 226/331  lung]
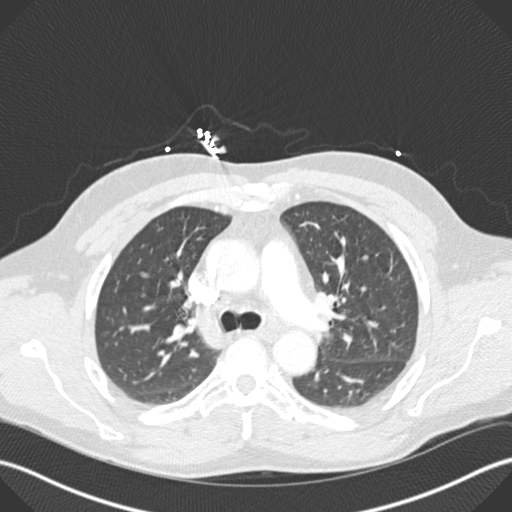
[im 256/331  soft-tissue]
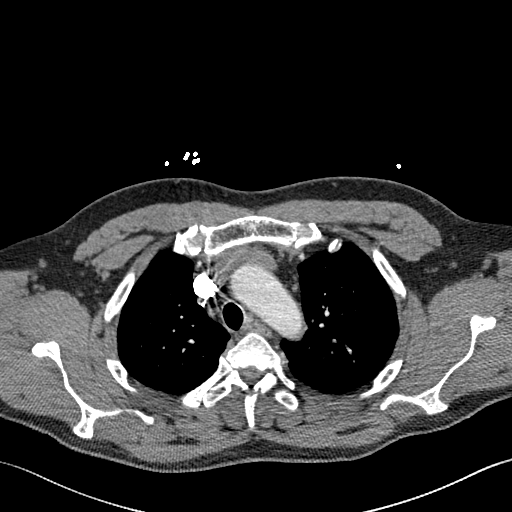
[im 271/331  lung]
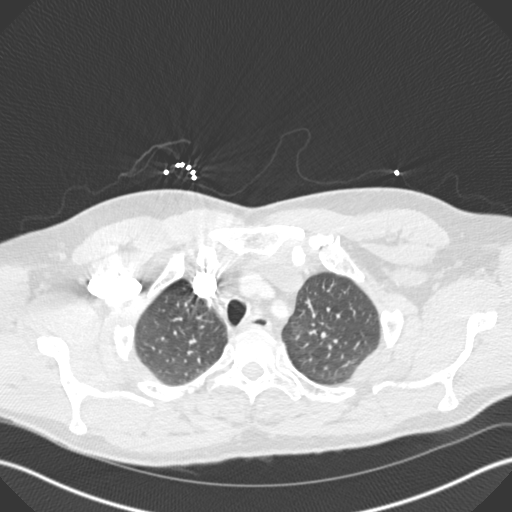
[im 286/331  soft-tissue]
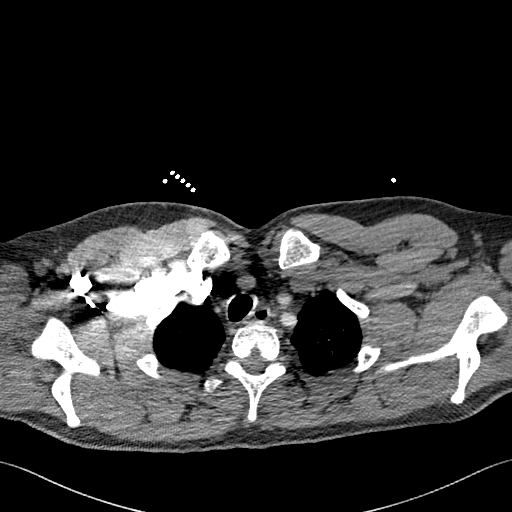
[im 316/331  lung]
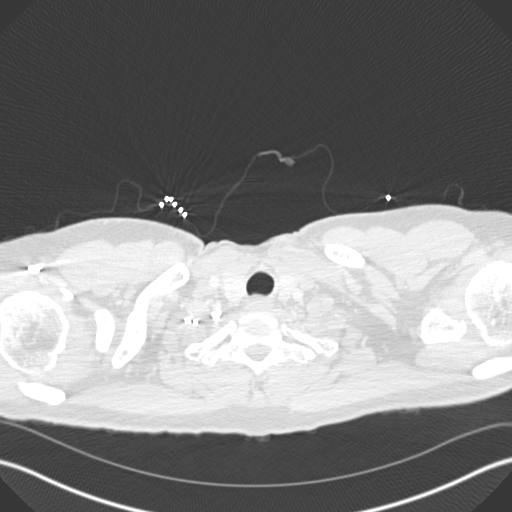

[Series 7: coronal mpr · coronal · 0.65mm/px · 3 of 135 slices shown]
[im 34/135  soft-tissue]
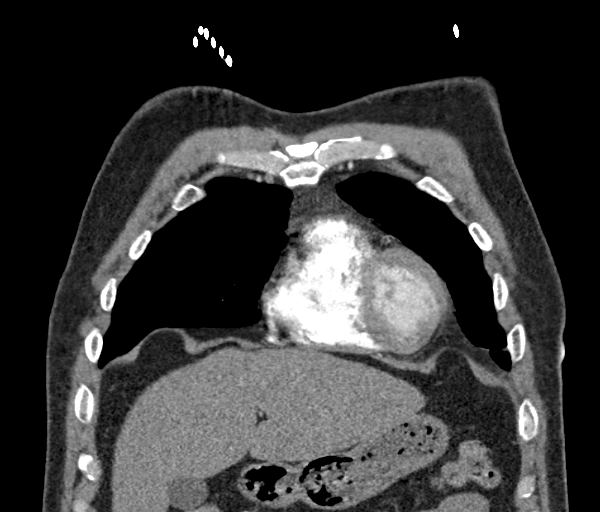
[im 68/135  soft-tissue]
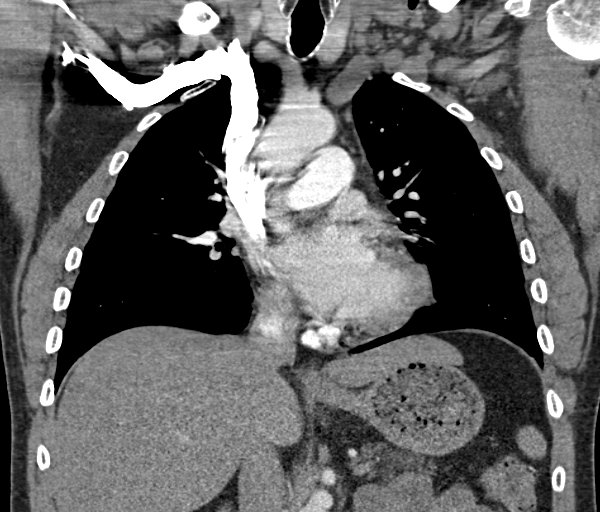
[im 101/135  soft-tissue]
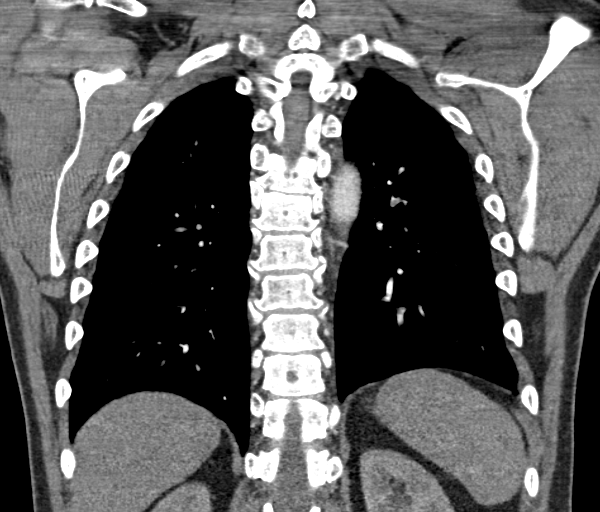

[18 of 46 positions shown; findings below may reference images not displayed]

FINDINGS: There is no evidence of pulmonary embolus.

There is a nonspecific 2.3 x 1.6 cm pleural-based nodule noted at
the inferior aspect of the left lingula. There is no evidence of
significant focal consolidation, pleural effusion or pneumothorax.

The mediastinum is unremarkable in appearance. Visualized
mediastinal nodes remain normal in size. Left hilar nodes measure up
to 8 mm in short axis, also normal in size. No pericardial effusion
is identified. The great vessels are grossly unremarkable in
appearance. No axillary lymphadenopathy is seen. The visualized
portions of the thyroid gland are unremarkable in appearance.

The visualized portions of the liver and spleen are unremarkable.
The visualized portions of the pancreas, gallbladder, stomach,
adrenal glands and kidneys are within normal limits.

No acute osseous abnormalities are seen. There is chronic deformity
of the mid right clavicle.

Review of the MIP images confirms the above findings.
IMPRESSION: 1. No evidence of pulmonary embolus.
2. Nonspecific 2.3 x 1.6 cm pleural-based nodule at the inferior
aspect of the left lingula. Malignancy cannot be excluded. PET/CT is
recommended for further evaluation. Alternatively, biopsy could be
considered; Pulmonary consultation would be helpful, as deemed
clinically appropriate.

## 2018-10-19 IMAGING — DX DG CHEST 2V
2 series · 2 of 2 positions shown · non-contrast
Comparison: 09/01/2014.

CLINICAL DATA: Routine follow-up.  Dyspnea.

EXAM:
CHEST  2 VIEW

[chest pa]
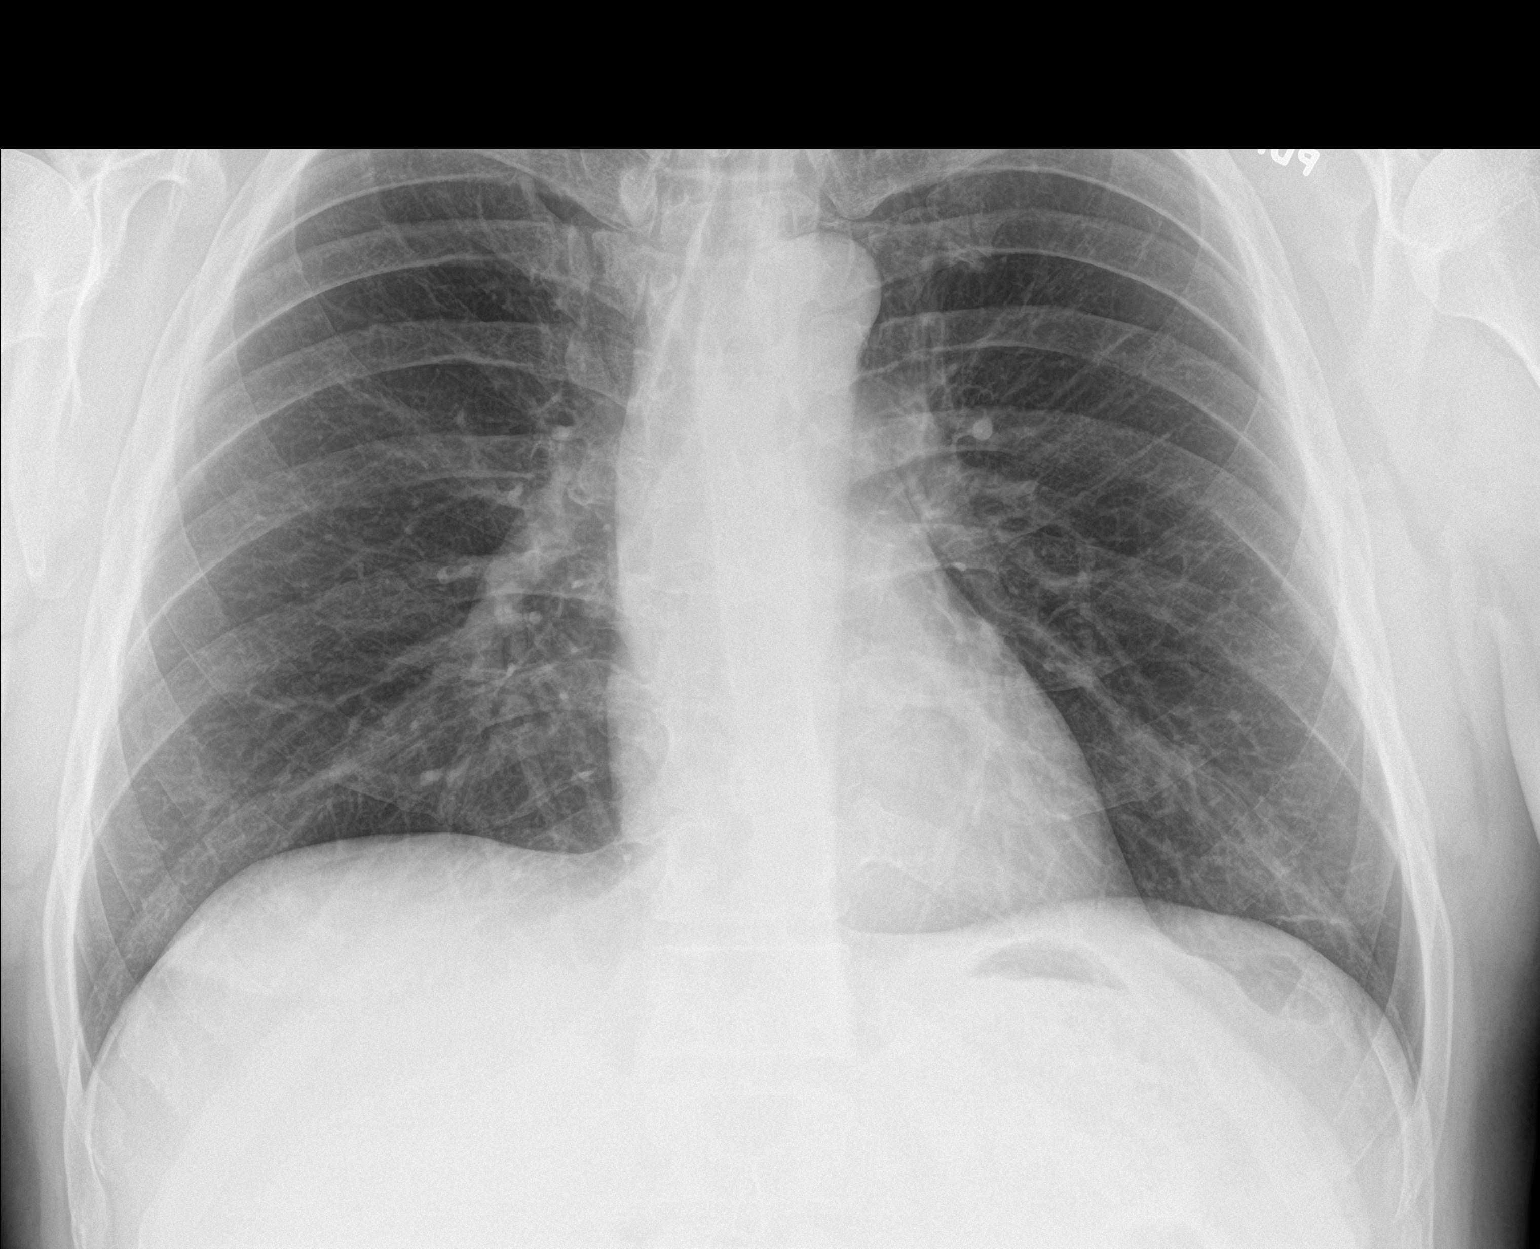

[chest lat]
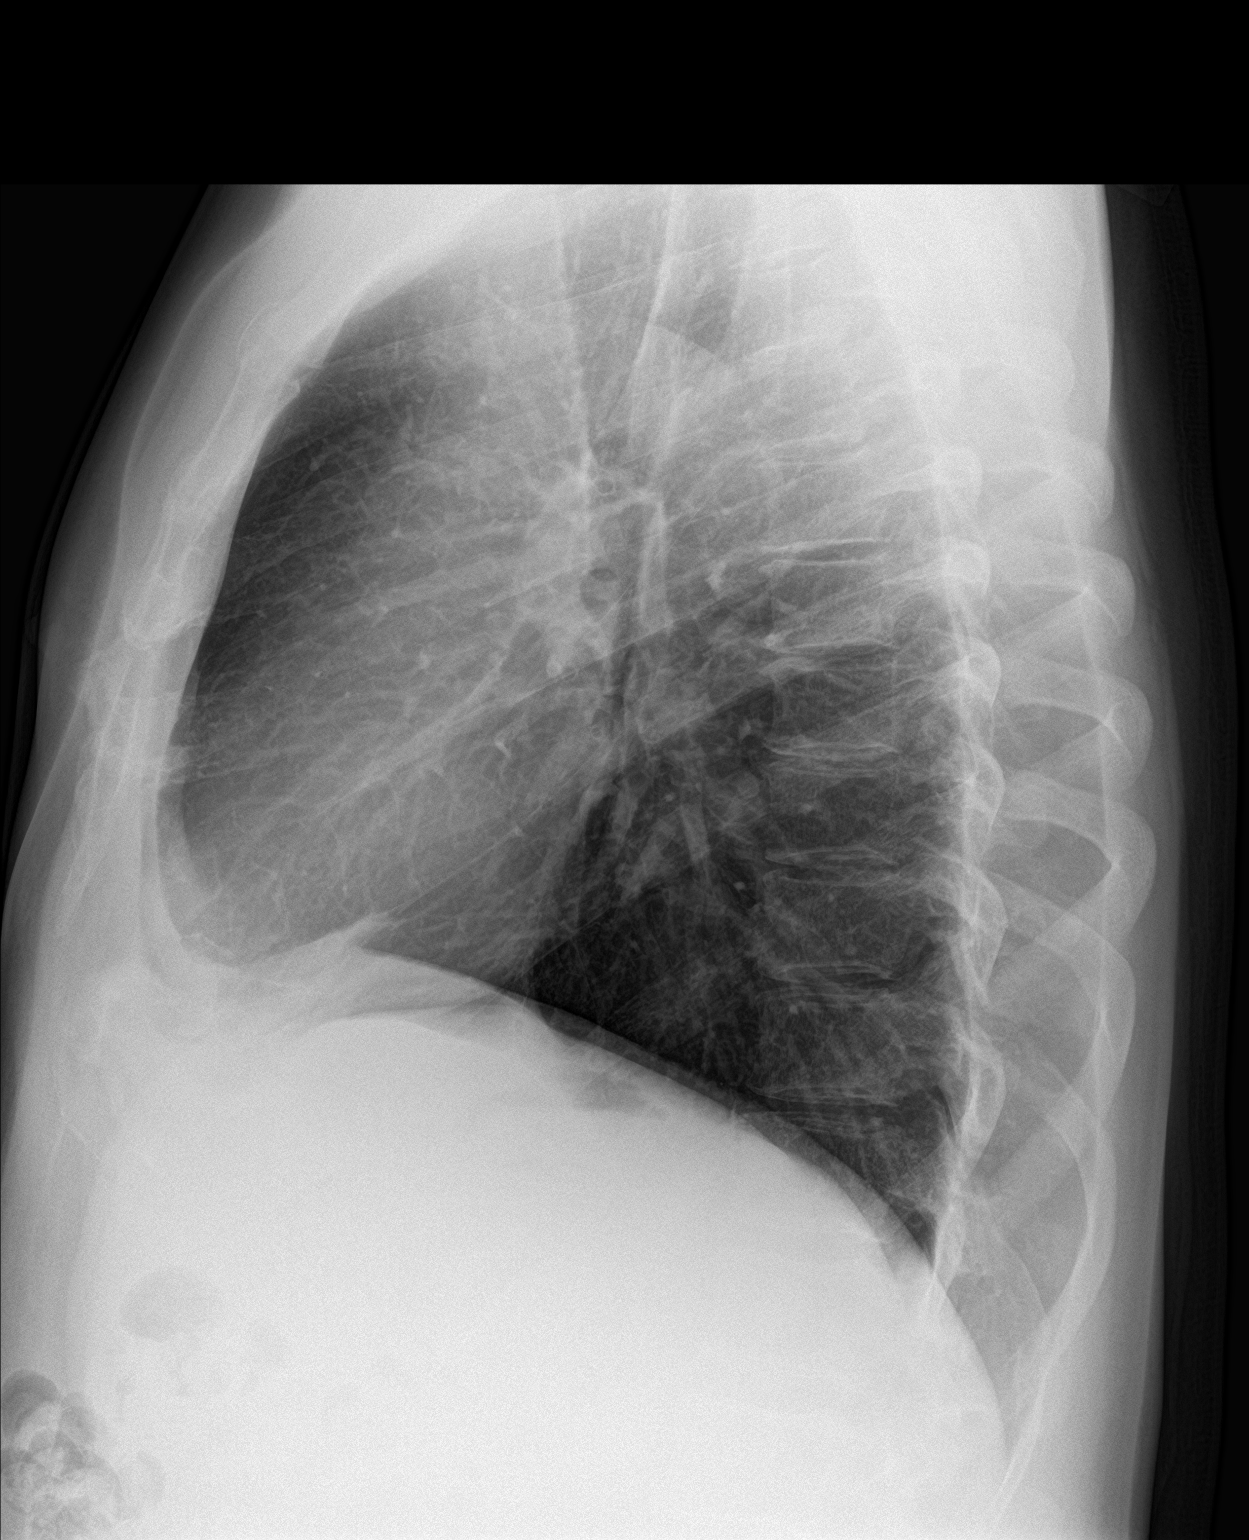

[2 of 2 positions shown; findings below may reference images not displayed]

FINDINGS: The heart size and mediastinal contours are within normal limits.
Both lungs are clear. The visualized skeletal structures are
unremarkable.
IMPRESSION: No active cardiopulmonary disease.

## 2020-12-16 NOTE — Telephone Encounter (Signed)
 Patient call back stating scripts were sent to wrong pharmacy again.

## 2020-12-17 NOTE — Addendum Note (Signed)
 Addended by: HARTWELL CHANTAL ALMARIE ARMIDA on: 12/17/2020 08:51 AM  Modules accepted: Orders

## 2020-12-17 NOTE — Telephone Encounter (Signed)
 He needs them sent again, they went to [facility] again.  Grayce, CMA   Electronically signed by: Grayce LELON Gentle, CMA 12/17/20 [phone]

## 2020-12-17 NOTE — Addendum Note (Signed)
 Addended by: BINGHAM, SONYA on: 12/17/2020 07:55 AM  Modules accepted: Orders

## 2020-12-17 NOTE — Telephone Encounter (Signed)
 Make sure I pulled them up correctly/swb   Electronically signed by: Grayce LELON Gentle, CMA 12/17/20 [phone]

## 2020-12-17 NOTE — Telephone Encounter (Signed)
 sent   Electronically signed by: Suzann Bryant Hedgecock, PA-C 12/17/20 [phone]

## 2021-03-16 NOTE — Progress Notes (Signed)
 [facility] [place] [address] [facility] [place] [address] [name] [address]   OMEGA SLAGER  April 09, 1970   Assessment and Plan:   Mike Gutierrez was seen today for add, pain and erectile dysfunction.  Diagnoses and all orders for this visit:  Erectile dysfunction, unspecified erectile dysfunction type -     sildenafiL, pulm.hypertension, (REVATIO) 20 mg tablet; 1-5 p.o. prior to sexual activity as needed -     Testosterone, Total And Free, Serum (Sendout); Future  Chronic pain of lower extremity, bilateral -     oxyCODONE (ROXICODONE) 10 mg IR tablet; Take 1 tablet (10 mg total) by mouth 4 (four)  times daily as needed.  Pain in soft tissues of limb -     oxyCODONE (ROXICODONE) 10 mg IR tablet; Take 1 tablet (10 mg total) by mouth 4 (four)  times daily as needed.  Attention deficit disorder, unspecified hyperactivity presence -     dextroamphetamine-amphetamine (ADDERALL) 30 mg tablet; Take 1 tablet (30 mg total) by mouth 2 times daily.  Essential hypertension -     verapamiL (VERELAN) 120 MG 24 hr capsule; Take 1 capsule (120 mg total) by mouth daily.  High risk medication use -     236175 12+Oxycodone+Crt-Unbund (Sendout)    Subjective:   Appropriate PPE of face mask worn by staff during the office visit.   The patient was also advised to wear PPE- face mask throughout the visit.  Safe distance was maintained for the discussion portion of the visit as much as possible.    Chief Complaint  Patient presents with  . ADD  . Pain  . Erectile Dysfunction    HPI: Patient comes in today for follow up on ADD medications.  He states the medication is tolerated well, with no adverse side effect The best to benefit the medicine brings is the ability to focus  He has no complaint of headache, palpitations, or vision changes  He has no new complaints today. He last took his ADD medications 03/22/2021 at 9 am.  Patient comes in for  follow up of pain medications. He has been taking pain medication primarily for legs. Last dose of medication was 03/22/2021 at 9 am.  Medication: Oxycodone (didn't bring bottle) Pill count: 0 Last fill date:   02/19/21  He has been taking the medications as prescribed.  He voices no adverse side effects to the medication. Specifically, no complaints of unmanaged constipation, headache or sedation.   The patient reports a perceived pain reduction from baseline without medication of 70%.  Using the PEG scale, patient rates their pain on average as a 4/10. They rate the interference in their quality of life over the last week as a 4/10 and they rate the interference in daily activities as a  4/10. [0=none and 10/10 maximum]  Over the last two weeks, patient describes no sense of loss of pleasure or low mood/depression  He has no had any injuries since they were last here.    The patient complains of having difficulty with performance of sexual activity though he feels like his desire is there, and he was not sure if it was related to his blood pressure medicine, age or general nature, or something else.  He has been trying to supplement with over-the-counter agents such as saw palmetto etc. to help with function but he has not had a testosterone level checked.   Reviewed [place] controlled substances registry.   He is  taking over the counter supplements. See list in medications.   He verbalizes understanding of their current diagnoses, medications and therapies and is appearing compliant with instructions.  Past Medical/Surgical History:   Patient Active Problem List  Diagnosis  . ADD (attention deficit disorder)  . Anxiety and depression  . History of aspiration pneumonia  . Chronic pain of lower extremity, bilateral  . BPH with obstruction/lower urinary tract symptoms  . Congenital pes planus  . Dysphagia  . Dyspnea  . Essential hypertension  . Excessive daytime sleepiness  . GERD  (gastroesophageal reflux disease)  . Left knee pain  . Lumbago  . Osteoarthrosis involving more than one site  . Osteoarthritis, localized, knee  . Pain in joint, lower leg  . Pain in soft tissues of limb  . Panic disorder without agoraphobia with mild panic attacks  . Penile venereal warts  . Primary localized osteoarthrosis, lower leg  . Snoring  . Thoracic or lumbosacral neuritis or radiculitis  . Chronic pain syndrome  . Neuropathic pain  . Primary insomnia  . Bilateral carpal tunnel syndrome  . History of COVID-19   Past Surgical History:  Procedure Laterality Date  . COLONOSCOPY    . HYDROCELE EXCISION / REPAIR    . KNEE SURGERY    . UPPER GASTROINTESTINAL ENDOSCOPY      Family History:   Family History  Problem Relation Age of Onset  . Aneurysm Mother   . Hypertension Mother   . No Known Problems Father   . No Known Problems Sister   . No Known Problems Brother   . Aneurysm Maternal Grandmother     Social History:   Social History   Socioeconomic History  . Marital status: Married    Spouse name: None  . Number of children: None  . Years of education: None  . Highest education level: None  Occupational History  . None  Tobacco Use  . Smoking status: Never  . Smokeless tobacco: Never  Substance and Sexual Activity  . Alcohol use: No  . Drug use: No  . Sexual activity: Yes    Partners: Female  Other Topics Concern  . None  Social History Narrative  . None   Social Determinants of Health   Financial Resource Strain: Not on file  Food Insecurity: Not on file  Transportation Needs: Not on file  Physical Activity: Not on file  Stress: Not on file  Social Connections: Not on file  Housing Stability: Not on file    Allergies:   Patient has no known allergies.  Current Medications:   Current Outpatient Medications  Medication Sig Dispense Refill  . albuterol 90 mcg/actuation inhaler Inhale 2 puffs into the lungs every 6 (six) hours as  needed for Wheezing. 1 Inhaler 0  . atenoloL (TENORMIN) 25 MG tablet START WITH HALF PILLTWICE DAILY FOR THE FIRST 5 DAYS AND THEN INCREASE TO A WHOLE PILL TWICE DAILY 180 tablet 0  . esomeprazole  (NEXIUM ) 40 MG capsule TAKE 1 CAPSULE BY MOUTH ONCE DAILY BEFORE BREAKFAST 90 capsule 1  . lisinopriL (PRINIVIL,ZESTRIL) 40 MG tablet TAKE 1 TABLET BY MOUTH DAILY 90 tablet 0  . oxyCODONE (ROXICODONE) 10 mg IR tablet Take 1 tablet (10 mg total) by mouth 4 (four)  times daily as needed. 120 each 0  . dextroamphetamine-amphetamine (ADDERALL) 30 mg tablet Take 1 tablet (30 mg total) by mouth 2 times daily. 60 tablet 0  . [START ON 05/21/2021] oxyCODONE (ROXICODONE) 10 mg IR tablet Take 1  tablet (10 mg total) by mouth 4 (four)  times daily as needed. 120 each 0  . sildenafiL, pulm.hypertension, (REVATIO) 20 mg tablet 1-5 p.o. prior to sexual activity as needed 50 tablet 0  . verapamiL (VERELAN) 120 MG 24 hr capsule Take 1 capsule (120 mg total) by mouth daily. 90 capsule 0   No current facility-administered medications for this visit.    ROS:   Review of Systems  Constitution:  chills, fever. Stable appetite, no change in weight. No malaise/fatigue.  Eyes: No visual changes or discharge.  Cardiovascular: No chest pain,  orthopnea, palpitations. No leg edema. Respiratory: No cough, no sputum production, no shortness of breath   GI: No abdominal pain, or bowel changes. Skin: No new complaints Psychiatric/Behavioral: No mood changes, sleep changes. All other review of systems not addressed above are negative or unchanged  Objective:    BP 129/84   Pulse 68   Wt 102.1 kg (225 lb)   SpO2 99%   BMI 32.28 kg/m    Physical Exam  Constitutional: Pt. appears well-developed and well-nourished. No acute distress Head: normocephalic, atraumatic.  Cardiovascular: Normal rate, regular rhythm and normal heart sounds. no evidence of peripheral edema.  Pulmonary/Chest: Effort normal. Breath sounds clear in  all fields Neurological:  no tremor. CN intact. Skin: Warm and dry.  Psychiatric: Alert and oriented to person, place, and time. Affect and responses appear without overt mood change. Vitals reviewed.    Prior to writing pain medications, the patient was surveillanced through the [place]  Registry and found to be compliant with prescriptions. Reviewed [place] controlled substances registry.  Per the new pain management laws, Dr. Alray was also advised on continued chronic treatment for pain and no changes in therapy recommended.  PLAN: 1. Chronic pain of lower extremity, bilateral I wrote for 1 month of medication for 2 reasons: #1 supply chain issues and make sure he can get it, #2 to make sure the urine drug screen is accurate.  If no issues, we will send the other 2 prescriptions in - oxyCODONE (ROXICODONE) 10 mg IR tablet; Take 1 tablet (10 mg total) by mouth 4 (four)  times daily as needed.  Dispense: 120 each; Refill: 0  2. Pain in soft tissues of limb As above - oxyCODONE (ROXICODONE) 10 mg IR tablet; Take 1 tablet (10 mg total) by mouth 4 (four)  times daily as needed.  Dispense: 120 each; Refill: 0  3. Attention deficit disorder, unspecified hyperactivity presence I wrote for 1 month of medication for reasons as above-#1 supply chain issues #2 to make sure the urine drug screen shows presence.  The last urine drug screen did not have any Adderall but we did not have the time he took his last dose to confirm that it is present or absent erroneously. - dextroamphetamine-amphetamine (ADDERALL) 30 mg tablet; Take 1 tablet (30 mg total) by mouth 2 times daily.  Dispense: 60 tablet; Refill: 0  4. Essential hypertension Continue black male without change but other prescriptions were not needed to be refilled at this time - verapamiL (VERELAN) 120 MG 24 hr capsule; Take 1 capsule (120 mg total) by mouth daily.  Dispense: 90 capsule; Refill: 0  5. Erectile dysfunction, unspecified  erectile dysfunction type We did talk about erectile dysfunction, and we will check both free and total testosterone and use a low-dose generic Viagra and discussion on use - sildenafiL, pulm.hypertension, (REVATIO) 20 mg tablet; 1-5 p.o. prior to sexual activity as needed  Dispense:  50 tablet; Refill: 0 - Testosterone, Total And Free, Serum (Sendout); Future  6. High risk medication use - 236175 12+Oxycodone+Crt-Unbund (Sendout)  Current and continued and new medications reviewed and patient understands their use and directions.  Goals to care reviewed.  He has no barriers to meeting goals discussed.   This document serves as a record of services personally performed by Suzann Hedgecock, PA-C.  It was created on their behalf by Grayce LELON Gentle, CMA, a trained medical scribe, and Certified Medical Assistant (CMA). During the course of documenting the history, physical exam and medical decision making, I was functioning as a stage manager. The creation of this record is the provider's dictation and/or activities during the visit.  Electronically signed by Grayce LELON Gentle, CMA 03/16/2021 8:39 AM    Return in about 3 months (around 06/22/2021) for pain and ADD f/u.   I agree the documentation is accurate and complete.  Electronically signed by: Grayce LELON Gentle, CMA 03/16/2021 8:38 AM    Note: This document was created using the aid of voice recognition Dragon dictation software.    Electronically signed by: Suzann Bryant Hedgecock, PA-C 03/22/21 1621

## 2021-03-22 NOTE — Patient Instructions (Signed)
 We will continue your ADD medications with no changes.  They have proven to be effective in helping to improve your focus and completion of tasks  Barring any need for changes in therapy or new issues, we will follow-up in 3 months for pain  For blood pressure, we will continue current medication without change at this time.   We will continue the current pain medication without changes that has proven helpful for reducing pain and improving quality of life. We will call you with urine drug screen results as soon as they have been reviewed We should see both the pain medication and the ADD medication if taken accurately and last dose was as recorded.  Please call our office with any questions or concerns. [phone]  If you have not already signed up for [facility] access, I would highly encourage you to do so if you have internet access. You will be able to view and monitor your labs, request medication refills and send our office non-emergent messages.

## 2021-03-23 NOTE — Progress Notes (Signed)
 Electronically signed by: Ike Jenkins Ditty, MD 03/23/2021 9:55 PM   Electronically signed by: Ike Jenkins Ditty, MD 03/23/21 2155
